# Patient Record
Sex: Female | Born: 1955 | Race: White | Hispanic: No | Marital: Married | Smoking: Never smoker
Health system: Southern US, Community
[De-identification: ages and names within clinical notes are randomized; demographics above are authoritative.]

## PROBLEM LIST (undated history)

## (undated) DIAGNOSIS — L299 Pruritus, unspecified: Secondary | ICD-10-CM

## (undated) DIAGNOSIS — I10 Essential (primary) hypertension: Secondary | ICD-10-CM

## (undated) DIAGNOSIS — F329 Major depressive disorder, single episode, unspecified: Secondary | ICD-10-CM

## (undated) DIAGNOSIS — F32A Depression, unspecified: Secondary | ICD-10-CM

## (undated) DIAGNOSIS — R51 Headache: Secondary | ICD-10-CM

## (undated) DIAGNOSIS — M1990 Unspecified osteoarthritis, unspecified site: Secondary | ICD-10-CM

## (undated) DIAGNOSIS — R519 Headache, unspecified: Secondary | ICD-10-CM

## (undated) HISTORY — PX: EYE SURGERY: SHX253

## (undated) HISTORY — PX: TONSILLECTOMY: SUR1361

## (undated) HISTORY — PX: REDUCTION MAMMAPLASTY: SUR839

---

## 1982-05-14 HISTORY — PX: REDUCTION MAMMAPLASTY: SUR839

## 1982-05-14 HISTORY — PX: BREAST REDUCTION SURGERY: SHX8

## 1999-04-13 ENCOUNTER — Encounter (INDEPENDENT_AMBULATORY_CARE_PROVIDER_SITE_OTHER): Payer: Self-pay

## 1999-04-13 ENCOUNTER — Other Ambulatory Visit: Admission: RE | Admit: 1999-04-13 | Discharge: 1999-04-13 | Payer: Self-pay | Admitting: Obstetrics and Gynecology

## 1999-08-18 ENCOUNTER — Other Ambulatory Visit: Admission: RE | Admit: 1999-08-18 | Discharge: 1999-08-18 | Payer: Self-pay | Admitting: Obstetrics and Gynecology

## 2000-12-19 ENCOUNTER — Other Ambulatory Visit: Admission: RE | Admit: 2000-12-19 | Discharge: 2000-12-19 | Payer: Self-pay | Admitting: Obstetrics and Gynecology

## 2001-01-06 ENCOUNTER — Encounter: Payer: Self-pay | Admitting: Obstetrics and Gynecology

## 2001-01-06 ENCOUNTER — Encounter: Admission: RE | Admit: 2001-01-06 | Discharge: 2001-01-06 | Payer: Self-pay | Admitting: Obstetrics and Gynecology

## 2001-01-08 ENCOUNTER — Encounter: Payer: Self-pay | Admitting: Obstetrics and Gynecology

## 2001-01-08 ENCOUNTER — Encounter: Admission: RE | Admit: 2001-01-08 | Discharge: 2001-01-08 | Payer: Self-pay | Admitting: Obstetrics and Gynecology

## 2001-12-19 ENCOUNTER — Other Ambulatory Visit: Admission: RE | Admit: 2001-12-19 | Discharge: 2001-12-19 | Payer: Self-pay | Admitting: Obstetrics and Gynecology

## 2002-08-17 ENCOUNTER — Ambulatory Visit (HOSPITAL_COMMUNITY): Admission: RE | Admit: 2002-08-17 | Discharge: 2002-08-17 | Payer: Self-pay | Admitting: *Deleted

## 2002-09-04 ENCOUNTER — Encounter: Admission: RE | Admit: 2002-09-04 | Discharge: 2002-09-04 | Payer: Self-pay | Admitting: Obstetrics and Gynecology

## 2002-09-04 ENCOUNTER — Encounter: Payer: Self-pay | Admitting: Obstetrics and Gynecology

## 2003-05-15 HISTORY — PX: GASTRIC BYPASS: SHX52

## 2003-08-17 ENCOUNTER — Encounter: Admission: RE | Admit: 2003-08-17 | Discharge: 2003-08-17 | Payer: Self-pay | Admitting: Obstetrics and Gynecology

## 2003-08-19 ENCOUNTER — Encounter: Admission: RE | Admit: 2003-08-19 | Discharge: 2003-08-19 | Payer: Self-pay | Admitting: Surgery

## 2003-08-24 ENCOUNTER — Encounter: Admission: RE | Admit: 2003-08-24 | Discharge: 2003-11-22 | Payer: Self-pay | Admitting: Surgery

## 2003-08-27 ENCOUNTER — Ambulatory Visit (HOSPITAL_COMMUNITY): Admission: RE | Admit: 2003-08-27 | Discharge: 2003-08-27 | Payer: Self-pay | Admitting: Surgery

## 2003-12-16 ENCOUNTER — Encounter: Admission: RE | Admit: 2003-12-16 | Discharge: 2004-01-10 | Payer: Self-pay | Admitting: Surgery

## 2004-01-04 ENCOUNTER — Inpatient Hospital Stay (HOSPITAL_COMMUNITY): Admission: RE | Admit: 2004-01-04 | Discharge: 2004-01-06 | Payer: Self-pay | Admitting: Surgery

## 2004-02-14 ENCOUNTER — Encounter: Admission: RE | Admit: 2004-02-14 | Discharge: 2004-05-14 | Payer: Self-pay | Admitting: Surgery

## 2004-02-17 ENCOUNTER — Other Ambulatory Visit: Admission: RE | Admit: 2004-02-17 | Discharge: 2004-02-17 | Payer: Self-pay | Admitting: Obstetrics and Gynecology

## 2004-05-14 HISTORY — PX: CHOLECYSTECTOMY: SHX55

## 2004-06-15 ENCOUNTER — Ambulatory Visit (HOSPITAL_COMMUNITY): Admission: RE | Admit: 2004-06-15 | Discharge: 2004-06-15 | Payer: Self-pay | Admitting: Gastroenterology

## 2004-07-14 ENCOUNTER — Encounter: Admission: RE | Admit: 2004-07-14 | Discharge: 2004-10-12 | Payer: Self-pay | Admitting: Surgery

## 2004-10-27 ENCOUNTER — Encounter: Admission: RE | Admit: 2004-10-27 | Discharge: 2004-10-27 | Payer: Self-pay | Admitting: Obstetrics and Gynecology

## 2004-10-31 ENCOUNTER — Encounter (INDEPENDENT_AMBULATORY_CARE_PROVIDER_SITE_OTHER): Payer: Self-pay | Admitting: *Deleted

## 2004-10-31 ENCOUNTER — Observation Stay (HOSPITAL_COMMUNITY): Admission: RE | Admit: 2004-10-31 | Discharge: 2004-10-31 | Payer: Self-pay | Admitting: Surgery

## 2005-02-13 ENCOUNTER — Ambulatory Visit (HOSPITAL_COMMUNITY): Admission: RE | Admit: 2005-02-13 | Discharge: 2005-02-13 | Payer: Self-pay | Admitting: Surgery

## 2005-03-01 ENCOUNTER — Other Ambulatory Visit: Admission: RE | Admit: 2005-03-01 | Discharge: 2005-03-01 | Payer: Self-pay | Admitting: Obstetrics and Gynecology

## 2005-03-20 ENCOUNTER — Encounter: Admission: RE | Admit: 2005-03-20 | Discharge: 2005-03-20 | Payer: Self-pay | Admitting: Obstetrics and Gynecology

## 2005-04-14 IMAGING — US US ABDOMEN COMPLETE
2 series · 13 of 25 positions shown · non-contrast
Comparison: none

CLINICAL DATA: Pre-operative respiratory examination.  Gastric bypass. 
 PA AND LATERAL CHEST:   
 The heart size and mediastinal contours are unremarkable.  The lungs are clear.  The visualized skeleton is unremarkable.

[Series 1: unknown · 0.27mm/px · 2 of 11 slices shown (1 of 2)]
[im 1/11]
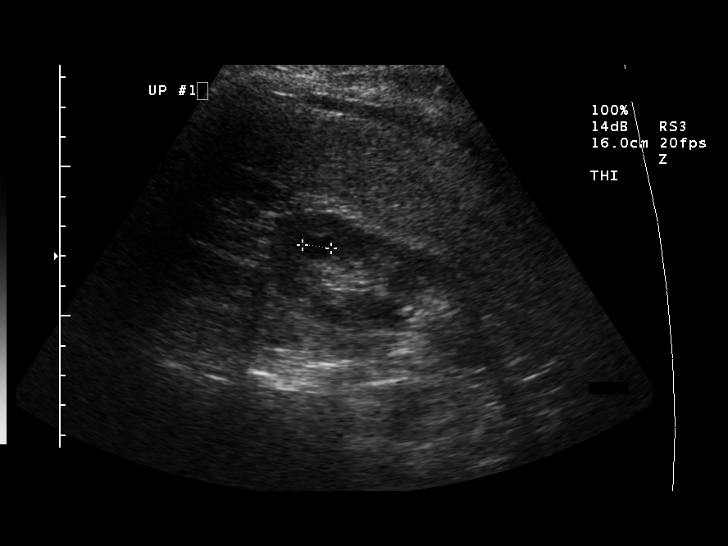
[im 11/11]
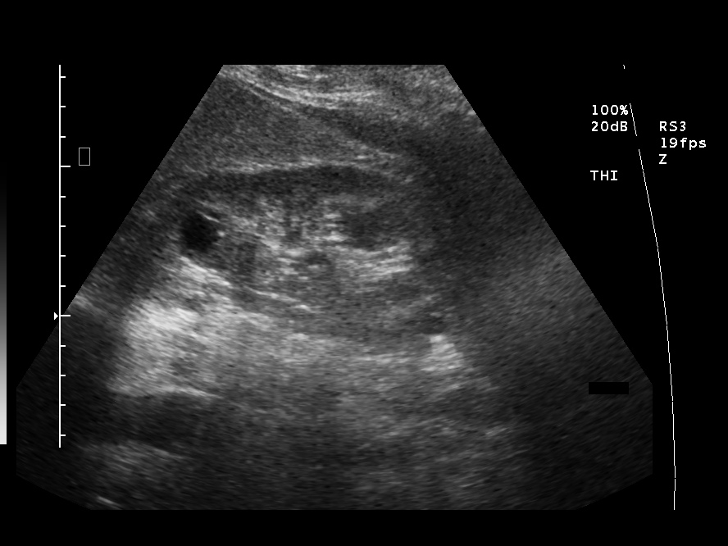

[Series 1: unknown · 0.27mm/px · 11 of 94 slices shown (2 of 2)]
[im 5/94]
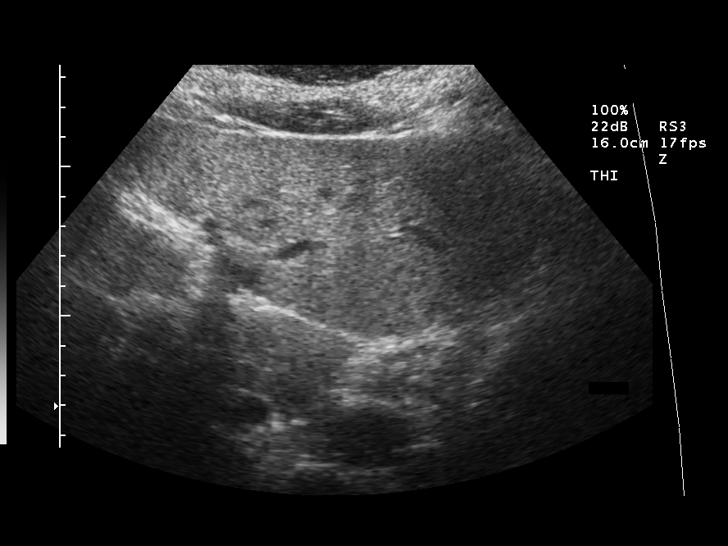
[im 14/94]
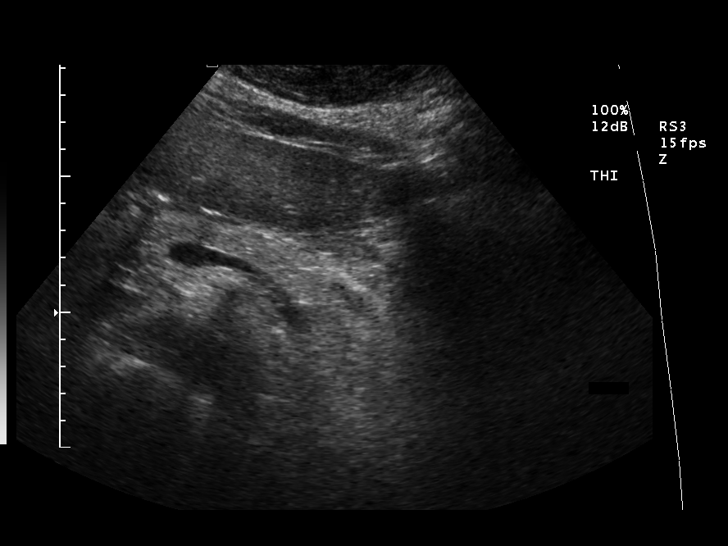
[im 23/94]
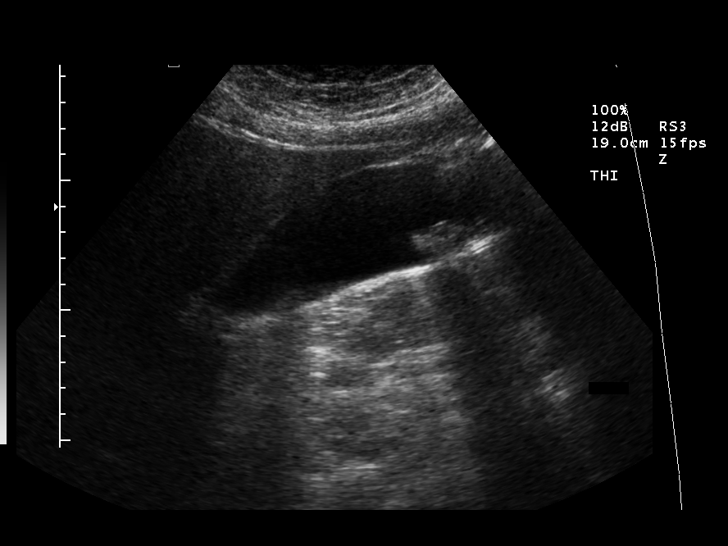
[im 32/94]
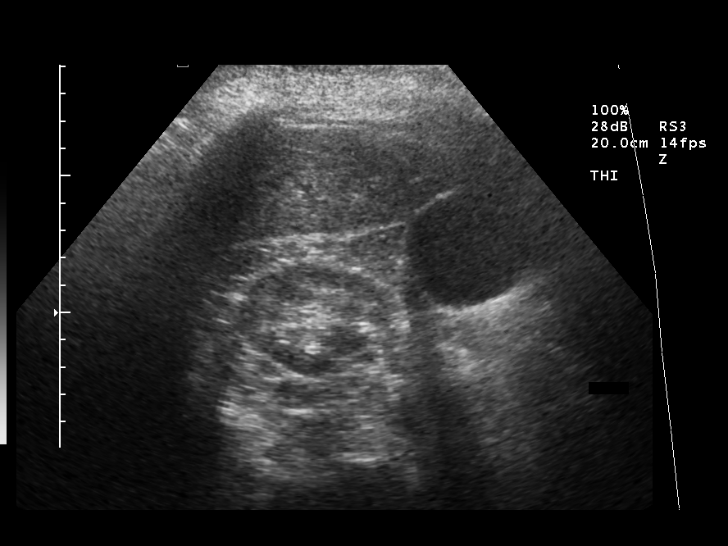
[im 40/94]
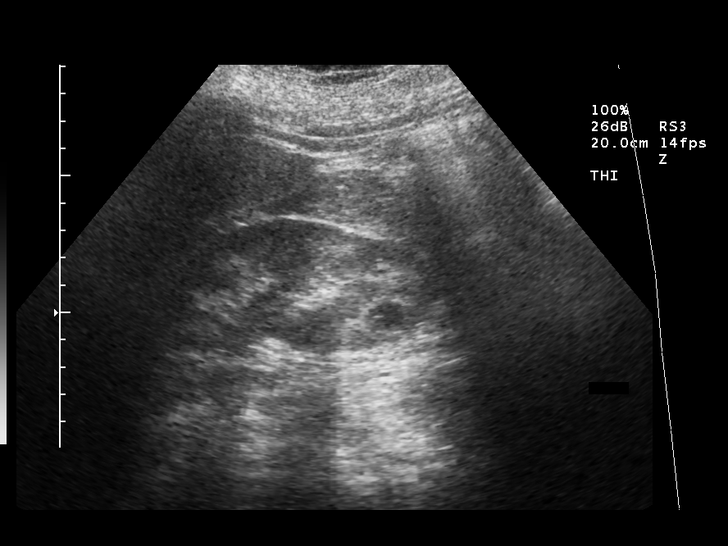
[im 49/94]
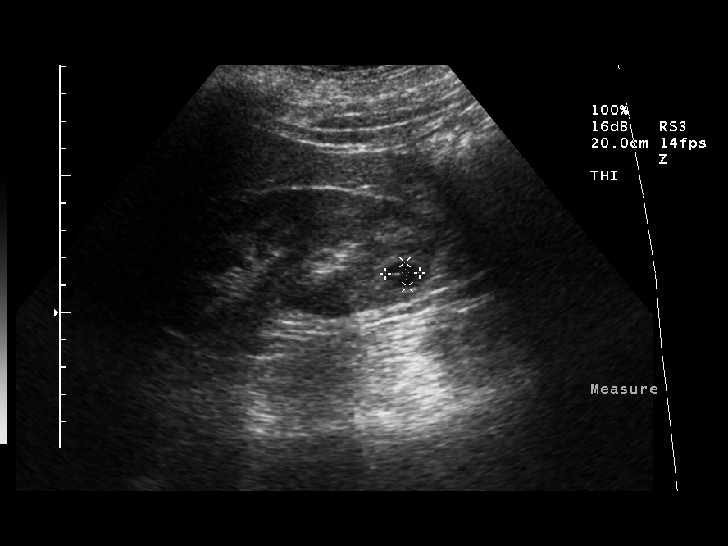
[im 58/94]
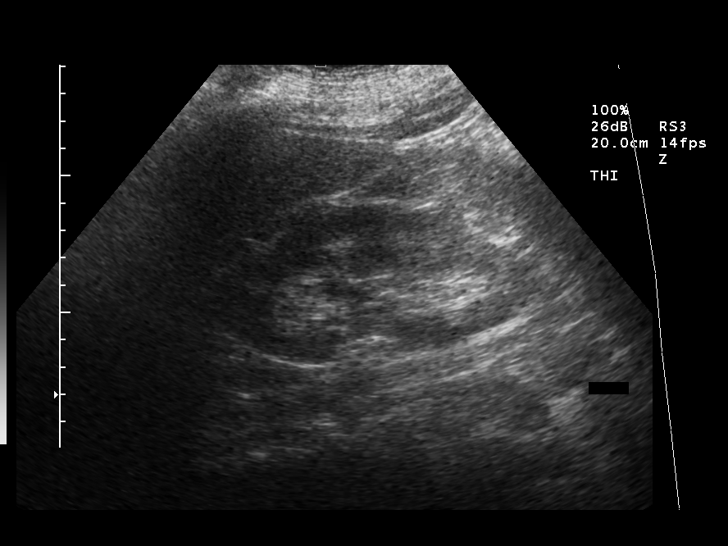
[im 67/94]
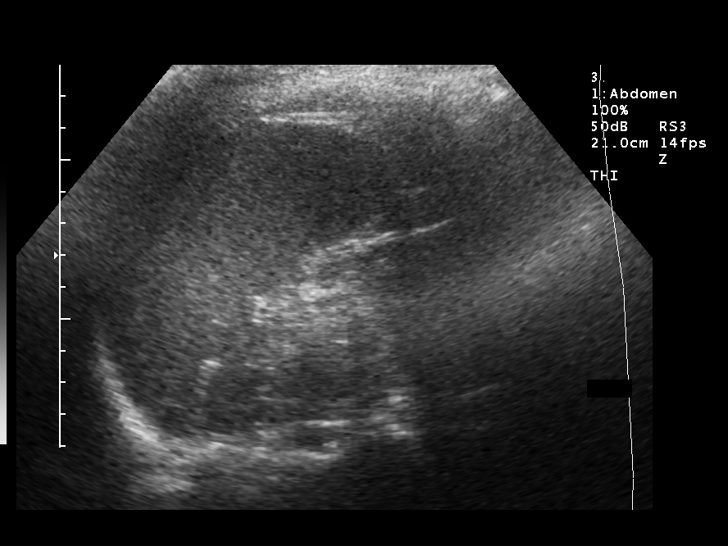
[im 76/94]
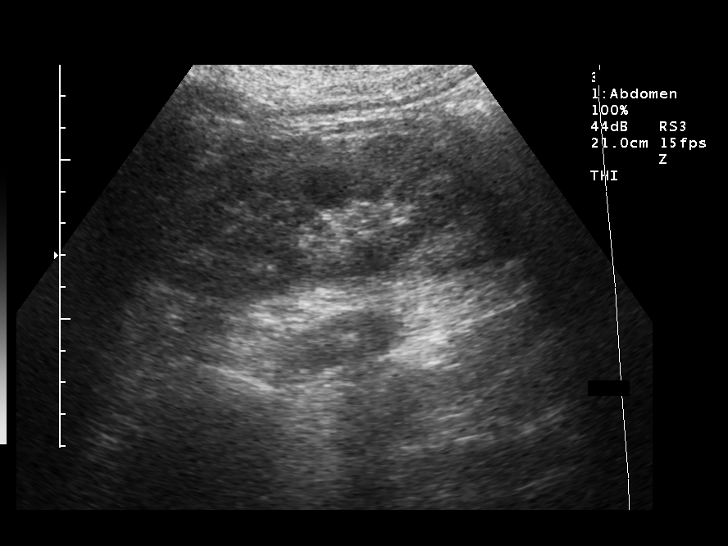
[im 85/94]
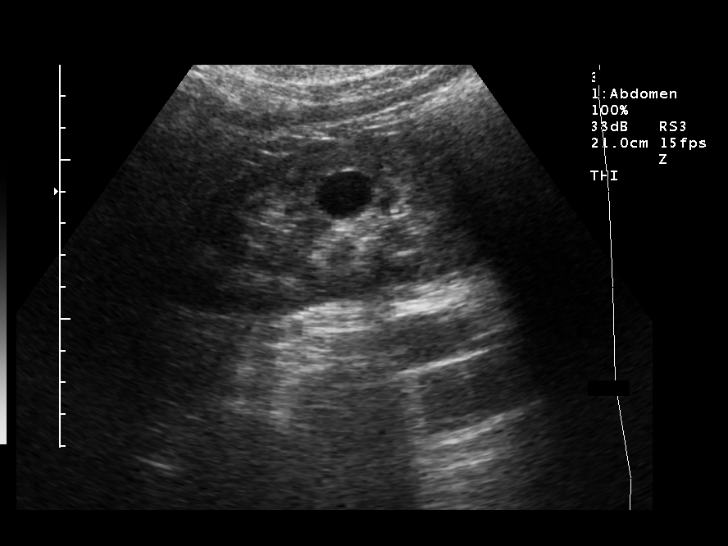
[im 94/94]
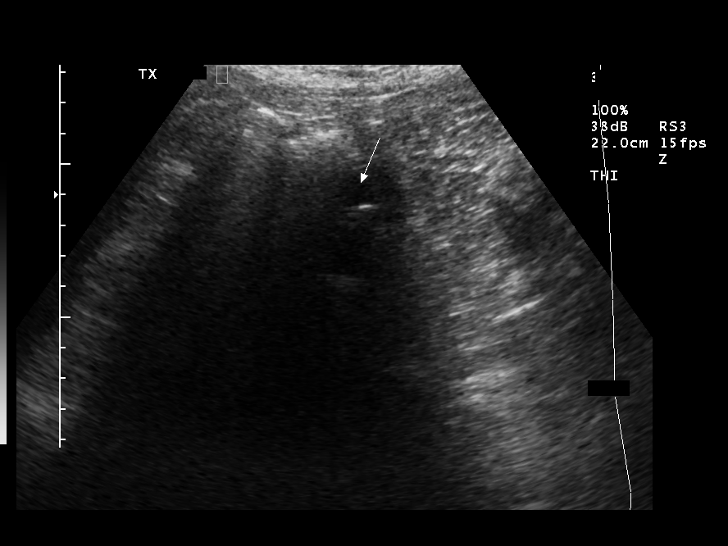

[13 of 25 positions shown; findings below may reference images not displayed]

IMPRESSION: No active disease. 
 COMPLETE ABDOMINAL ULTRASOUND: 
 Patient has multiple gallstones.  There are also some echogenic foci in the gallbladder wall consistent with adenomyomatosis.  Gallbladder wall is not thickened.  Common bile duct is normal at 4mm in diameter.  There is slight increased echogenicity of the liver parenchyma suggestive of fatty infiltration.  The pancreas, inferior vena cava, and spleen appear normal.  There is a 17mm cyst in the mid portion of the left kidney.  Left kidney measures 12.8cm overall length and otherwise appears normal.  There are three cysts in the right kidney, the largest being 1.6cm in diameter.  Tiny echogenic focus in one of the cysts which is not thought to be significant.
IMPRESSION: Multiple gallstones.  Benign appearing cysts in both kidneys.  Slight fatty liver.

## 2005-06-08 ENCOUNTER — Encounter: Admission: RE | Admit: 2005-06-08 | Discharge: 2005-06-08 | Payer: Self-pay | Admitting: Surgery

## 2005-12-06 ENCOUNTER — Encounter: Admission: RE | Admit: 2005-12-06 | Discharge: 2005-12-06 | Payer: Self-pay | Admitting: Obstetrics and Gynecology

## 2005-12-26 ENCOUNTER — Encounter: Admission: RE | Admit: 2005-12-26 | Discharge: 2005-12-26 | Payer: Self-pay | Admitting: Obstetrics and Gynecology

## 2006-05-16 ENCOUNTER — Other Ambulatory Visit: Admission: RE | Admit: 2006-05-16 | Discharge: 2006-05-16 | Payer: Self-pay | Admitting: Obstetrics and Gynecology

## 2006-12-12 ENCOUNTER — Encounter: Admission: RE | Admit: 2006-12-12 | Discharge: 2006-12-12 | Payer: Self-pay | Admitting: Obstetrics and Gynecology

## 2007-07-01 ENCOUNTER — Other Ambulatory Visit: Admission: RE | Admit: 2007-07-01 | Discharge: 2007-07-01 | Payer: Self-pay | Admitting: Obstetrics and Gynecology

## 2007-12-18 ENCOUNTER — Encounter: Admission: RE | Admit: 2007-12-18 | Discharge: 2007-12-18 | Payer: Self-pay | Admitting: Surgery

## 2008-09-09 ENCOUNTER — Other Ambulatory Visit: Admission: RE | Admit: 2008-09-09 | Discharge: 2008-09-09 | Payer: Self-pay | Admitting: Obstetrics and Gynecology

## 2008-09-23 ENCOUNTER — Encounter: Admission: RE | Admit: 2008-09-23 | Discharge: 2008-09-23 | Payer: Self-pay | Admitting: Obstetrics and Gynecology

## 2008-12-09 ENCOUNTER — Encounter: Admission: RE | Admit: 2008-12-09 | Discharge: 2008-12-09 | Payer: Self-pay | Admitting: Surgery

## 2010-01-06 ENCOUNTER — Encounter: Admission: RE | Admit: 2010-01-06 | Discharge: 2010-01-06 | Payer: Self-pay | Admitting: Obstetrics and Gynecology

## 2010-06-04 ENCOUNTER — Encounter: Payer: Self-pay | Admitting: Obstetrics and Gynecology

## 2010-09-29 NOTE — Op Note (Signed)
NAME:  Doris Anderson, Doris Anderson NO.:  [id number]   MEDICAL RECORD NO.:  [id number]                   PATIENT TYPE:  OIB   LOCATION:  2899                                 FACILITY:  [facility]   PHYSICIAN:  Corbin Ade, M.D.                 DATE OF BIRTH:  May 18, 1955   DATE OF PROCEDURE:  DATE OF DISCHARGE:  08/17/2002                                 OPERATIVE REPORT   PREOPERATIVE DIAGNOSIS:  Cataract of the left eye.   POSTOPERATIVE DIAGNOSIS:  Cataract of the left eye.   OPERATION PERFORMED:  Extracapsular cataract extraction by  phacoemulsification with placement of posterior chamber intraocular lens  implant of the left eye.   SURGEON:  Corbin Ade, M.D.   ANESTHESIA:  Topical.   DESCRIPTION OF PROCEDURE:  The patient was brought to the operating room and  placed in the supine position.  Attention was given to the left eye only.  The patient was prepped and draped in the usual sterile ophthalmic fashion.  A operating microscope was used and a lid speculum was placed on the  appropriate eye.  A 2.75 mm keratome was used to make a clear corneal  incision at the limbus at approximately the 11 o'clock position.  A clear  corneal incision was made at the limbus at approximately the 11 o'clock  position using a 2.75 mm keratome.  Through this clear corneal incision, the  anterior chamber was reinflated using Viscoat.  A 15 degree blade was then  used to make a paracentesis at the limbus at approximately the 2 o'clock  position.  A prebent cystotome was then used to initiate a capsulorrhexis  which was completed with Utrata's forceps.  Hydrodissection delineation was  done with balanced salt solution on a Steri-Tip cannula.  Phacoemulsification was then done in a divide and conquer manner without any  complications.  Residual cortexes were removed from the capsular bag using  the automated irrigation aspiration silicone handpiece.  The anterior  chamber and  the posterior capsular bag were reinflated using Provisc through  the clear corneal wound.  An SN60OT 13.5 diopter, serial number [id number]  intraocular lens was implanted directly into the capsular bag.  The silicone  tipped irrigation aspiration handpiece was then used to remove residual  Provisc from the interior of the eye through the clear corneal wound.  The  clear corneal wound was hydrated and checked for leaks.  None were found.  Pressure in the eye at the end of the case was less than 20 by  tactile sensation.  The patient had two drops of Ocuflox placed topically on  the eye at the end of the case.  The patient had a hard shield placed over  the eye at the end of the case.  The patient left the operating room in good  and stable condition.  There were no complications.  Corbin Ade, M.D.    RC/MEDQ  D:  08/17/2002  T:  08/17/2002  Job:  [id number]

## 2010-09-29 NOTE — Op Note (Signed)
NAME:  Doris Anderson, POSAS NO.:  [id number]   MEDICAL RECORD NO.:  [id number]          PATIENT TYPE:  AMB   LOCATION:  ENDO                         FACILITY:  [facility]   PHYSICIAN:  Sandria Bales. Ezzard Standing, M.D.  DATE OF BIRTH:  07-05-1955   DATE OF PROCEDURE:  02/13/2005  DATE OF DISCHARGE:                                 OPERATIVE REPORT   PREOPERATIVE DIAGNOSIS:  Epigastric abdominal pain, status post laparoscopic  Roux-en-Y gastric bypass.   POSTOPERATIVE DIAGNOSIS:  Normal Roux-en-Y gastrojejunal bypass with no  evidence of ulcer disease.   PROCEDURE:  Esophagogastroduodenoscopy.   SURGEON:  Sandria Bales. Ezzard Standing, M.D.   FIRST ASSISTANT:  None.   ANESTHESIA:  Demerol 50 mg, Versed 3 mg.   COMPLICATIONS:  None.   INDICATIONS FOR PROCEDURE:  [name] is a 55 year old white female who  underwent a laparoscopic Roux-en-Y gastric bypass in August, 2005.  She  successfully lost over 100 pounds of weight.  Then in June, 2006, she  underwent a cholecystectomy and closure of ventral hernias.  She has  developed epigastric abdominal pain of unclear etiology.  She has been  placed on Nexium for about 2-3 weeks and has noted significant improvement  in her symptoms.  She now comes for upper endoscopy to evaluate her pouch  and possibly her ulcer disease.  Indications and potential complications were explained to the patient.   OPERATIVE NOTE:  [name] is monitoered with a blood pressure cuff,  Pulse oximetry, EKG, and had 2 liters of nasal O2 flowing during the  procedure.  The back of her throat was first anesthetized with Cetacaine.  She was then rolled to her left lateral decubitus position and given 50 mg  of Demerol and 3 mg of Versed IV.   A flexible Olympus endoscope was passed without difficulty down the throat  into her stomach pouch.  I identified both limbs of her jejunostomy.  I was  able to go some 25-30 cm down her functioning jejunal limb.  Her  gastrojejunal anastomosis was noted at 44 cm from her incisors.  Her  gastroesophageal junction was about 39 cm from her incisors for about a 5 cm  pouch.  She may have a minimal or modest hiatal hernia of 1-2 cm, but she  had no evidence of ulcer disease.  The mucosa of both her jejunum and her  stomach were unremarkable.  Her esophagus was unremarkable.  We found no  source or cause for her pain.   The plan is for her to continue Nexium for four more weeks.  That will be a  total of about 6-8 weeks of Nexium, and she can stop it.  She will be in  touch with our office if she has any further symptoms.  Her husband was at  her bedside when I discussed this with her.      Sandria Bales. Ezzard Standing, M.D.  Electronically Signed     DHN/MEDQ  D:  02/13/2005  T:  02/13/2005  Job:  [id number]   cc:   Artist Pais, M.D.  Fax: [phone]   Duncan Dull,  M.D.  Fax: [phone]

## 2010-09-29 NOTE — Op Note (Signed)
NAME:  RHIANON, ZABAWA NO.:  [id number]   MEDICAL RECORD NO.:  [id number]          PATIENT TYPE:  AMB   LOCATION:  ENDO                         FACILITY:  [facility]   PHYSICIAN:  Danise Edge, M.D.   DATE OF BIRTH:  November 15, 1955   DATE OF PROCEDURE:  06/15/2004  DATE OF DISCHARGE:                                 OPERATIVE REPORT   PROCEDURE:  Screening colonoscopy.   INDICATIONS:  Ms. Shasta Chinn is a 55 year old female born 2055/11/28.  Ms.  Gage is scheduled to undergo a peripheral screening colonoscopy with  polypectomy to prevent colon cancer.   ENDOSCOPIST:  Janeece Fitting, M.D.   PREMEDICATIONS:  Versed 6 mg, Demerol 50 mg.   PROCEDURE:  After obtaining informed consent, Ms. Gillyard was placed in the  left lateral decubitus position.  I administered intravenous Demerol and  intravenous Versed to achieve conscious sedation.   The patient's blood pressure, oxygen saturation and cardiac rhythm were  monitored throughout the procedure and admitted in the medical record.  Anal  inspection and digital rectal examination was normal.  The Olympus  adjustable pediatric colonoscope was introduced into the rectum and advanced  to the cecum.  An normal appearing ileocecal valve was intubated and the  distal ileum was inspected.  Colonic preparation to the examination today  was excellent.  The rectum was normal.   Sigmoid colon and descending colon were normal.  Splenic flexure was normal.  Transverse colon normal.  Hepatic flexure normal.  Ascending colon was  normal.  Cecum and ileocecal valve was normal.  Distal ileum was normal.   ASSESSMENT:  A normal proctocolonoscopy to the cecum.      MJ/MEDQ  D:  06/15/2004  T:  06/15/2004  Job:  [id number]   cc:   Duncan Dull, M.D.  [address]  [place]  [place] [address]  Fax: [phone]   Artist Pais, M.D.  [address], [address]  [place]  [place] [address]  Fax: [phone]   Sandria Bales. Ezzard Standing, M.D.  [address]  [place]  [place] [address]

## 2010-09-29 NOTE — Op Note (Signed)
NAME:  Doris Anderson, Doris Anderson NO.:  [id number]   MEDICAL RECORD NO.:  [id number]          PATIENT TYPE:  OBV   LOCATION:  1420                         FACILITY:  [facility]   PHYSICIAN:  Sandria Bales. Ezzard Standing, M.D.  DATE OF BIRTH:  05-Dec-1955   DATE OF PROCEDURE:  10/31/2004  DATE OF DISCHARGE:                                 OPERATIVE REPORT   PREOPERATIVE DIAGNOSES:  Chronic cholecystitis, cholelithiasis.   POSTOPERATIVE DIAGNOSES:  Chronic cholecystitis, cholelithiasis and two  internal hernias (hernia at jejunojejunostomy, and hernia at Peterson's  defect).   PROCEDURE:  Laparoscopic cholecystectomy with intraoperative cholangiogram  and closure of internal hernias (approximately 1 hour time spent closing  internal hernias).   SURGEON:  Sandria Bales. Ezzard Standing, M.D.   FIRST ASSISTANT:  Thornton Park. Daphine Deutscher, MD.   ANESTHESIA:  General endotracheal.   ESTIMATED BLOOD LOSS:  Minimal.   INDICATIONS FOR PROCEDURE:  Ms. Ogden is a 55 year old white female who  has had a laparoscopic roux-en-Y gastric bypass in August 2005 and has  successfully lost about 90 pounds. She has developed recurring abdominal  pain and had an ultrasound revealing gallstones and felt to have probable  symptomatic cholelithiasis.   I discussed with her the indications and potential complications of surgery.  The potential complications include but not limited to bleeding, infection,  bowel injury, bile duct injury and the possibility of open surgery. Also I  told her that we would look for any problems with her roux-en-Y bypass at  the same time we did her surgery.   DESCRIPTION OF PROCEDURE:  The patient was placed in the supine position,  given a general endotracheal anesthesia. Her abdomen was prepped with  Betadine solution and sterilely draped. She was given 1 g of Ancef at the  initiation of the procedure. I placed a Hasson trocar at the umbilical port  and secured this with a #0 Vicryl suture.   I placed three additional trocars, a 10 mm subxiphoid trocar, a 5 mm right  mid subcostal trocar and a 5 mm lateral subcostal trocar. The gallbladder  was identified, it was distended. We actually decompressed the gallbladder  and she had a hydrops of her gallbladder.   There was clear or white bile. Dissection was carried out at the gallbladder  cystic duct junction. I developed the triangle of Calot, identified the  cystic duct. I placed a clip on the gallbladder side of the cystic duct and  shot an intraoperative cholangiogram.   The intraoperative cholangiogram was shot using a cut off taut catheter  inserted through a 14 gauge Jelco catheter into the side of the cystic duct  and secured with an endoclip. I used about 6 mL of 1/2 strength Hypaque  solution and this showed free flow of contrast down the cystic duct into the  common bile duct and down into the duodenum. There is no filling defect, no  mass and contrast reflects up into the hepatic radicles.   This was a normal intraoperative cholangiogram. The taut catheter was then  removed and the cystic duct triply endoclipped and divided.   I identified  at least two branches of the cystic artery, I clipped these  doubly both times.   The gallbladder was then sharply and bluntly dissected from the gallbladder  bed. Prior to complete division of the gallbladder from the gallbladder bed,  I reinspected the triangle of Calot, reinspected the gallbladder bed and  there was no bleeding and no bile leak. The gallbladder was then placed into  an EndoCatch bag, delivered through the umbilicus, sent to pathology.   I then did a gentle exploration with the laparoscope and I looked back at  her roux-en-Y bypass. Unfortunately she had two internal hernias which were  obvious at examination. One was at the jejunojejunostomy and the other was  at the Peterson's defect.   I  closed both these defects laparoscopically sewing these defects  closed.  Using 2-0 silk suture, I used two sutures for the Peterson's defect, I used  a single suture for the jejunojejunal defect.   I spent approximately one hour both identifying this hernia defect and  repairing them.   She tolerated this procedure well but because of the identification of  internal hernias, rather than going home today, I will plan to keep her  overnight unless she does real well.   I removed the trocars under direct visualization and there was no bleeding  at the trocar site. The umbilical port was closed with a #0 Vicryl suture.  The skin at each site was closed with a 5-0 Vicryl suture, painted with  tincture of Benzoin and Steri-Strips.   She tolerated the procedure well and was transported to the recovery room in  good condition. Sponge and needle counts were correct at the end of the  case.       DHN/MEDQ  D:  10/31/2004  T:  10/31/2004  Job:  [id number]   cc:   Duncan Dull, M.D.  [address]  [address]  [place]  [place] [address]  Fax: [phone]   Artist Pais, M.D.  [address], [address]  [place]  [place] [address]  Fax: [phone]

## 2010-09-29 NOTE — Op Note (Signed)
NAME:  Doris Anderson, Doris Anderson                       ACCOUNT NO.:  [id number]   MEDICAL RECORD NO.:  [id number]                   PATIENT TYPE:  INP   LOCATION:  0462                                 FACILITY:  [facility]   PHYSICIAN:  Sandria Bales. Ezzard Standing, M.D.               DATE OF BIRTH:  November 03, 1955   DATE OF PROCEDURE:  01/04/2004  DATE OF DISCHARGE:                                 OPERATIVE REPORT   PREOPERATIVE DIAGNOSIS:  Morbid obesity with a body mass index of 45.7.   POSTOPERATIVE DIAGNOSIS:  Morbid obesity with a body mass index of 45.7.   PROCEDURE:  Laparoscopic Roux-en-Y (endocolic/endogastric).   SURGEON:  Sandria Bales. Ezzard Standing, M.D.   FIRST ASSISTANT:  Thornton Park. Daphine Deutscher, M.D.   ANESTHESIA:  General endotracheal.   ESTIMATED BLOOD LOSS:  Minimal.   INDICATIONS FOR PROCEDURE:  Ms. Zeimet is a 55 year old white female who  has been morbidly obese much of her adult life.  She has failed multiple  diets, has been through our preoperative bariatric surgery program, and now  comes for attempted laparoscopic Roux-en-Y gastric bypass.   The patient's preoperative BMI is 45.7 with a weight of 248 pounds.  She  understands the indications and potential risks of the procedure.  The  potential risks include but are not limited to bleeding, infection, bowel  leak, deep venous thrombosis, and permanent change in dieting, long term.   Patient presented to the operating room, where she was given Lovenox  preoperatively and Cefotetan preoperatively.  She had PAS stockings in  place.  Her abdomen was prepped with Betadine solution and sterilely draped  after general anesthesia, supervised by Dr. Frutoso Chase.  The abdomen was  prepped with Betadine solution and sterilely draped.  I entered the abdomen,  using a 12 mm trocar as a puncture technique into the abdominal cavity.  I  insufflated the abdomen.  I then placed a trocar opposite the umbilicus, a  10/11 mm trocar.  I placed 12 mm trocars,  right subcostal, right paramedian,  left paramedian, and then a 5 mm left lateral subcostal.   Abdominal exploration carried out revealed the right and left lobes of the  liver was unremarkable.  The gallbladder had known gallstones, and we  discussed this with her.  It really did not look grossly normal.  Her  stomach was mildly distended but otherwise normal.  Her spleen was  unremarkable.  The bowel, which we could see, was unremarkable, and down  towards her pelvis was unremarkable.   My first thing was to identify the ligament of Treitz, which I did.  I  counted back approximately 40 cm and identified the small bowel.  It had  adequate mesentery to reach up to the stomach.  I then divided the small  bowel with a single firing of the Endo GIA-45 stapler.   I took down the mesentery using a harmonic scalpel.  I marked  the future  gastric limb with a Penrose drain and a pseudosilk suture and counted 100  cm, actually about 105 cm, for the gastric limb.  I then did a side-to-side  stapled anastomosis using an Endo GIA-45 stapler.  I then closed the  enterotomy with two running 2-0 Vicryl sutures.  I then closed the mesentery  with a running 2-0 silk suture.  The mesentery was closed.  The  jejunojejunostomy looked good and viable.  I had to put an extra stitch in  the anterior surface.  I then covered this with about 1.5 cc of Tisseel.  I  then turned my attention to the upper abdomen.  I placed a liver retractor  using a Nathanson retractor and placed this under the left lobe of the  liver.  I then first cleared off the angle of His off the greater curvature  of the stomach between the greater curvature of the stomach and the  diaphragm.  I then went along the lesser curvature, approximately 4-5 cm,  and got into the lesser sac and did four firings across the stomach, one  transversely and then freed up the wall of the stomach, to create a pouch,  which was about 3 cm in diameter,  about 4-5 cm in length.  I checked for  hemostasis.  There was no bleeding.  I then placed a seal at the GE junction  and along the distal stomach pouch.   I then brought my small bowel up and did a side-to-side anastomosis.  I did  a posterior layer with a 2-0 Vicryl suture.  Then using a Ewald tube, I  directed into the stomach and did a firing, making about a 2 to 2.5 cm  opening using an Endo GIA stapler.  I closed the enterotomy with two running  2-0 Vicryl sutures.  I then did an anterior 2-0 Vicryl suture over the  staple line.  This is all done antecolic and antegastric.   At this point, Dr. Daphine Deutscher __________ up and did an  esophagogastroduodenoscopy.  He will dictate this portion of the operation.  Highlights include that I clamped off the bowel.  He was able to get the  scope into the stomach without difficulty.  The gastrojejunal junction was  right at 44-45 cm.  The GE junction was right at 39 cm for about a 5 cm  pouch.  There was no bleeding.  There was no evidence of air leak or leak  from the anastomosis.   I then irrigated out the abdomen.  I brought a 10 mm Blake drain through the  5 mm port in the left subcostal area.  I then reinspected both the  gastrojejunal anastomosis, which I covered with Tisseel, and the  jejunojejunal anastomosis, and both looked patent with no evidence of any  ischemic bowel or compromised bowel.  I then removed the trocars in turn,  showing no bleeding in any trocar site.  I closed each skin site with 5-0  Vicryl suture, painted each one with tincture of Benzoin and Steri-Strips.  The Blake drain was sewn in place with a 2-0 nylon suture.   The patient tolerated the procedure well and was transported to the recovery  room in good condition.  Sponge and needle counts were correct at the end of  the case.  Sandria Bales. Ezzard Standing, M.D.    DHN/MEDQ  D:  01/04/2004  T:  01/04/2004  Job:  [id number]   cc:    Duncan Dull, M.D.  [address]  [place]  [place] [address]  Fax: [phone]   Artist Pais, M.D.  [address], [address]  [place]  [place] [address]  Fax: [phone]

## 2010-09-29 NOTE — Op Note (Signed)
NAME:  [name], [name]                       ACCOUNT NO.:  [id number]   MEDICAL RECORD NO.:  [id number]                   PATIENT TYPE:  INP   LOCATION:  0462                                 FACILITY:  [facility]   PHYSICIAN:  Thornton Park. Daphine Deutscher, M.D.             DATE OF BIRTH:  [birth year]   DATE OF PROCEDURE:  01/04/2004  DATE OF DISCHARGE:                                 OPERATIVE REPORT   PROCEDURE:  Upper endoscopy, status post laparoscopic Roux-en-Y gastric  bypass.   OPERATOR:  Thornton Park. Daphine Deutscher, M.D.   DESCRIPTION OF PROCEDURE:  Doris Anderson is a 55 year old lady  undergoing laparoscopic Roux Y gastric bypass.  When the proximal  anastomosis had been created, I broke scrub and went to the head of the  table where I passed the flexible endoscope.  This was introduced into the  hypopharynx with my finger and gently passed into the proximal esophagus as  it came down under videoscopic clearance.  It passed easily down to the  esophagogastric junction which was right about 38-39 cm.  We then entered  into what appeared to be a small gastric pouch.  It did not show any  evidence of bleeding.  I insufflated with gas while Dr. Ezzard Standing clamped the  distal bowel, and we got insufflation, and he was observed for evidence of  bubbles or evidence of a leak.  None were seen.  I then passed the scope  down to the anastomosis where we could readily see both limbs of the small  intestinal anastomosis.  Suture and staple line was not bleeding and was  intact.  Pouch measured approximately 5 cm in length on the way back.  The  pouch was then decompressed, and the scope was withdrawn without difficulty.                                               Thornton Park Daphine Deutscher, M.D.    MBM/MEDQ  D:  01/04/2004  T:  01/04/2004  Job:  [id number]   cc:   Sandria Bales. Ezzard Standing, M.D.  [address]  [place]  [place] [address]  Fax: [phone]

## 2010-12-06 ENCOUNTER — Other Ambulatory Visit: Payer: Self-pay | Admitting: Family Medicine

## 2010-12-06 ENCOUNTER — Other Ambulatory Visit (HOSPITAL_COMMUNITY)
Admission: RE | Admit: 2010-12-06 | Discharge: 2010-12-06 | Disposition: A | Discharge status: Home / Self Care | Payer: Commercial Indemnity | Source: Ambulatory Visit | Attending: Family Medicine | Admitting: Family Medicine

## 2010-12-06 DIAGNOSIS — Z124 Encounter for screening for malignant neoplasm of cervix: Secondary | ICD-10-CM | POA: Insufficient documentation

## 2010-12-22 ENCOUNTER — Other Ambulatory Visit: Payer: Self-pay | Admitting: Family Medicine

## 2010-12-22 DIAGNOSIS — Z1231 Encounter for screening mammogram for malignant neoplasm of breast: Secondary | ICD-10-CM

## 2011-01-09 ENCOUNTER — Ambulatory Visit: Payer: Self-pay

## 2011-01-25 ENCOUNTER — Ambulatory Visit
Admission: RE | Admit: 2011-01-25 | Discharge: 2011-01-25 | Disposition: A | Discharge status: Home / Self Care | Payer: Commercial Indemnity | Source: Ambulatory Visit | Attending: Family Medicine | Admitting: Family Medicine

## 2011-01-25 ENCOUNTER — Other Ambulatory Visit: Payer: Self-pay | Admitting: Family Medicine

## 2011-01-25 DIAGNOSIS — Z1231 Encounter for screening mammogram for malignant neoplasm of breast: Secondary | ICD-10-CM

## 2011-01-29 ENCOUNTER — Other Ambulatory Visit: Payer: Self-pay | Admitting: Interventional Cardiology

## 2011-01-29 ENCOUNTER — Inpatient Hospital Stay: Admission: RE | Admit: 2011-01-29 | Payer: Commercial Indemnity | Source: Ambulatory Visit

## 2011-01-29 ENCOUNTER — Ambulatory Visit: Payer: Commercial Indemnity

## 2011-01-29 DIAGNOSIS — N63 Unspecified lump in unspecified breast: Secondary | ICD-10-CM

## 2011-01-30 ENCOUNTER — Other Ambulatory Visit: Payer: Self-pay | Admitting: Family Medicine

## 2011-01-30 DIAGNOSIS — R928 Other abnormal and inconclusive findings on diagnostic imaging of breast: Secondary | ICD-10-CM

## 2011-02-08 ENCOUNTER — Other Ambulatory Visit: Payer: Self-pay | Admitting: Family Medicine

## 2011-02-08 ENCOUNTER — Ambulatory Visit
Admission: RE | Admit: 2011-02-08 | Discharge: 2011-02-08 | Disposition: A | Discharge status: Home / Self Care | Payer: Commercial Indemnity | Source: Ambulatory Visit | Attending: Family Medicine | Admitting: Family Medicine

## 2011-02-08 DIAGNOSIS — N6320 Unspecified lump in the left breast, unspecified quadrant: Secondary | ICD-10-CM

## 2011-02-08 DIAGNOSIS — R928 Other abnormal and inconclusive findings on diagnostic imaging of breast: Secondary | ICD-10-CM

## 2012-01-08 ENCOUNTER — Other Ambulatory Visit: Payer: Self-pay | Admitting: Family Medicine

## 2012-01-08 DIAGNOSIS — Z1231 Encounter for screening mammogram for malignant neoplasm of breast: Secondary | ICD-10-CM

## 2012-02-18 ENCOUNTER — Ambulatory Visit
Admission: RE | Admit: 2012-02-18 | Discharge: 2012-02-18 | Disposition: A | Discharge status: Home / Self Care | Payer: Commercial Indemnity | Source: Ambulatory Visit | Attending: Family Medicine | Admitting: Family Medicine

## 2012-02-18 DIAGNOSIS — Z1231 Encounter for screening mammogram for malignant neoplasm of breast: Secondary | ICD-10-CM

## 2013-03-16 ENCOUNTER — Other Ambulatory Visit: Payer: Self-pay

## 2013-03-16 ENCOUNTER — Ambulatory Visit: Payer: Commercial Indemnity

## 2013-03-16 DIAGNOSIS — Z1231 Encounter for screening mammogram for malignant neoplasm of breast: Secondary | ICD-10-CM

## 2013-04-10 ENCOUNTER — Ambulatory Visit: Payer: Commercial Indemnity

## 2013-05-12 ENCOUNTER — Ambulatory Visit
Admission: RE | Admit: 2013-05-12 | Discharge: 2013-05-12 | Disposition: A | Discharge status: Home / Self Care | Payer: Managed Care, Other (non HMO) | Source: Ambulatory Visit

## 2013-05-12 DIAGNOSIS — Z1231 Encounter for screening mammogram for malignant neoplasm of breast: Secondary | ICD-10-CM

## 2013-12-16 ENCOUNTER — Other Ambulatory Visit: Payer: Self-pay | Admitting: Family Medicine

## 2013-12-16 ENCOUNTER — Other Ambulatory Visit (HOSPITAL_COMMUNITY)
Admission: RE | Admit: 2013-12-16 | Discharge: 2013-12-16 | Disposition: A | Discharge status: Home / Self Care | Payer: Managed Care, Other (non HMO) | Source: Ambulatory Visit | Attending: Family Medicine | Admitting: Family Medicine

## 2013-12-16 DIAGNOSIS — Z124 Encounter for screening for malignant neoplasm of cervix: Secondary | ICD-10-CM | POA: Insufficient documentation

## 2013-12-17 LAB — CYTOLOGY - PAP

## 2014-09-06 ENCOUNTER — Other Ambulatory Visit: Payer: Self-pay

## 2014-09-06 DIAGNOSIS — Z1231 Encounter for screening mammogram for malignant neoplasm of breast: Secondary | ICD-10-CM

## 2014-09-16 ENCOUNTER — Ambulatory Visit
Admission: RE | Admit: 2014-09-16 | Discharge: 2014-09-16 | Disposition: A | Discharge status: Home / Self Care | Payer: Managed Care, Other (non HMO) | Source: Ambulatory Visit

## 2014-09-16 DIAGNOSIS — Z1231 Encounter for screening mammogram for malignant neoplasm of breast: Secondary | ICD-10-CM

## 2015-06-23 ENCOUNTER — Other Ambulatory Visit: Payer: Self-pay | Admitting: Gastroenterology

## 2015-08-01 ENCOUNTER — Encounter (HOSPITAL_COMMUNITY): Payer: Self-pay | Admitting: *Deleted

## 2015-08-09 ENCOUNTER — Encounter (HOSPITAL_COMMUNITY)
Admission: RE | Disposition: A | Discharge status: Home / Self Care | Payer: Self-pay | Source: Ambulatory Visit | Attending: Gastroenterology

## 2015-08-09 ENCOUNTER — Ambulatory Visit (HOSPITAL_COMMUNITY): Payer: Managed Care, Other (non HMO) | Admitting: Certified Registered Nurse Anesthetist

## 2015-08-09 ENCOUNTER — Encounter (HOSPITAL_COMMUNITY): Payer: Self-pay

## 2015-08-09 ENCOUNTER — Ambulatory Visit (HOSPITAL_COMMUNITY)
Admission: RE | Admit: 2015-08-09 | Discharge: 2015-08-09 | Disposition: A | Discharge status: Home / Self Care | Payer: Managed Care, Other (non HMO) | Source: Ambulatory Visit | Attending: Gastroenterology | Admitting: Gastroenterology

## 2015-08-09 DIAGNOSIS — Z6833 Body mass index (BMI) 33.0-33.9, adult: Secondary | ICD-10-CM | POA: Diagnosis not present

## 2015-08-09 DIAGNOSIS — Z79899 Other long term (current) drug therapy: Secondary | ICD-10-CM | POA: Diagnosis not present

## 2015-08-09 DIAGNOSIS — Z9884 Bariatric surgery status: Secondary | ICD-10-CM | POA: Diagnosis not present

## 2015-08-09 DIAGNOSIS — E669 Obesity, unspecified: Secondary | ICD-10-CM | POA: Diagnosis not present

## 2015-08-09 DIAGNOSIS — I10 Essential (primary) hypertension: Secondary | ICD-10-CM | POA: Insufficient documentation

## 2015-08-09 DIAGNOSIS — Z1211 Encounter for screening for malignant neoplasm of colon: Secondary | ICD-10-CM | POA: Diagnosis present

## 2015-08-09 DIAGNOSIS — Z9049 Acquired absence of other specified parts of digestive tract: Secondary | ICD-10-CM | POA: Insufficient documentation

## 2015-08-09 HISTORY — DX: Major depressive disorder, single episode, unspecified: F32.9

## 2015-08-09 HISTORY — PX: COLONOSCOPY WITH PROPOFOL: SHX5780

## 2015-08-09 HISTORY — DX: Unspecified osteoarthritis, unspecified site: M19.90

## 2015-08-09 HISTORY — DX: Headache: R51

## 2015-08-09 HISTORY — DX: Headache, unspecified: R51.9

## 2015-08-09 HISTORY — DX: Essential (primary) hypertension: I10

## 2015-08-09 HISTORY — DX: Pruritus, unspecified: L29.9

## 2015-08-09 HISTORY — DX: Depression, unspecified: F32.A

## 2015-08-09 SURGERY — COLONOSCOPY WITH PROPOFOL
Anesthesia: Monitor Anesthesia Care

## 2015-08-09 MED ORDER — PROPOFOL 10 MG/ML IV BOLUS
INTRAVENOUS | Status: AC
  Filled 2015-08-09: qty 20

## 2015-08-09 MED ORDER — SODIUM CHLORIDE 0.9 % IV SOLN: INTRAVENOUS | Status: DC

## 2015-08-09 MED ORDER — PROPOFOL 10 MG/ML IV BOLUS
INTRAVENOUS | Status: DC | PRN
  Administered 2015-08-09: 100 mg via INTRAVENOUS
  Administered 2015-08-09: 40 mg via INTRAVENOUS
  Administered 2015-08-09 (×2): 30 mg via INTRAVENOUS
  Administered 2015-08-09: 50 mg via INTRAVENOUS

## 2015-08-09 MED ORDER — LACTATED RINGERS IV SOLN
INTRAVENOUS | Status: DC
  Administered 2015-08-09: 12:00:00 via INTRAVENOUS

## 2015-08-09 SURGICAL SUPPLY — 21 items

## 2015-08-09 NOTE — Op Note (Signed)
[facility] Patient Name: Doris BellowGinger Anderson Procedure Date: 08/09/2015 MRN: [id number] Attending MD: Charolett BumpersMartin K Johnson , MD Date of Birth: [birth year] CSN:  Age: [age] Admit Type: Outpatient Procedure:                Colonoscopy Indications:              Screening for colorectal malignant neoplasm. Normal                            screening colonoscopy was performed on 06/15/2004. Providers:                Charolett BumpersMartin K. Johnson, MD, Dwain SarnaPatricia Ford, RN, Darletta MollJackie                            Aiken, Technician Referring MD:              Medicines:                Propofol per Anesthesia Complications:            No immediate complications. Estimated Blood Loss:     Estimated blood loss: none. Procedure:                Pre-Anesthesia Assessment:                           - Prior to the procedure, a History and Physical                            was performed, and patient medications and                            allergies were reviewed. The patient's tolerance of                            previous anesthesia was also reviewed. The risks                            and benefits of the procedure and the sedation                            options and risks were discussed with the patient.                            All questions were answered, and informed consent                            was obtained. Prior Anticoagulants: The patient has                            taken no previous anticoagulant or antiplatelet                            agents. ASA Grade Assessment: II - A patient with  mild systemic disease. After reviewing the risks                            and benefits, the patient was deemed in                            satisfactory condition to undergo the procedure.                           After obtaining informed consent, the colonoscope                            was passed under direct vision. Throughout the   procedure, the patient's blood pressure, pulse, and                            oxygen saturations were monitored continuously. The                            EC-[id number] ([id number]) scope was introduced through                            the anus and advanced to the the cecum, identified                            by appendiceal orifice and ileocecal valve. The                            colonoscopy was performed without difficulty. The                            patient tolerated the procedure well. The quality                            of the bowel preparation was good. The terminal                            ileum, the ileocecal valve, the appendiceal orifice                            and the rectum were photographed. Scope In: 1:13:19 PM Scope Out: 1:30:32 PM Scope Withdrawal Time: 0 hours 9 minutes 10 seconds  Total Procedure Duration: 0 hours 17 minutes 13 seconds  Findings:      The perianal and digital rectal examinations were normal.      The entire examined colon appeared normal. Impression:               - The entire examined colon is normal.                           - No specimens collected. Moderate Sedation:      N/A- Per Anesthesia Care Recommendation:           - Patient has a contact number available for  emergencies. The signs and symptoms of potential                            delayed complications were discussed with the                            patient. Return to normal activities tomorrow.                            Written discharge instructions were provided to the                            patient.                           - Repeat colonoscopy in 10 years for screening                            purposes.                           - Resume previous diet.                           - Continue present medications. Procedure Code(s):        --- Professional ---                           [phone], Colonoscopy, flexible; diagnostic,  including                            collection of specimen(s) by brushing or washing,                            when performed (separate procedure) Diagnosis Code(s):        --- Professional ---                           Z12.11, Encounter for screening for malignant                            neoplasm of colon CPT copyright 2016 [employer]. All rights reserved. The codes documented in this report are preliminary and upon coder review may  be revised to meet current compliance requirements. Danise Edge, MD Charolett Bumpers, MD 08/09/2015 1:37:03 PM This report has been signed electronically. Number of Addenda: 0

## 2015-08-09 NOTE — H&P (Signed)
  Procedure: Screening colonoscopy. Normal screening colonoscopy performed on 06/15/2004  History: The patient is a 60 year old female born [birth year]. She is scheduled to undergo a repeat screening colonoscopy today.  Past medical history: Depression. Allergic rhinitis. Roux-en-Y gastric bypass surgery. Hypertension. Small fiber neuropathy. Tonsillectomy. Bilateral breast reduction surgeries. Cataract surgery. Cholecystectomy. Right rotator cuff repair. Schrager finger release on the right hand surgery.  Medication allergies: Codeine and lisinopril.  Exam: The patient is alert and lying comfortably on the endoscopy stretcher. Abdomen is soft and nontender to palpation. Lungs are clear to auscultation. Cardiac exam reveals a regular rhythm.  Plan: Proceed with screening colonoscopy

## 2015-08-09 NOTE — Transfer of Care (Signed)
Immediate Anesthesia Transfer of Care Note  Patient: Doris Anderson  Procedure(s) Performed: Procedure(s): COLONOSCOPY WITH PROPOFOL (N/A)  Patient Location: PACU  Anesthesia Type:MAC  Level of Consciousness: awake, alert  and oriented  Airway & Oxygen Therapy: Patient Spontanous Breathing and Patient connected to face mask oxygen  Post-op Assessment: Report given to RN and Post -op Vital signs reviewed and stable  Post vital signs: Reviewed and stable  Last Vitals:  Filed Vitals:   08/09/15 1205  BP: 160/95  Pulse: 71  Temp: 36.8 C  Resp: 11    Complications: No apparent anesthesia complications

## 2015-08-09 NOTE — Anesthesia Postprocedure Evaluation (Signed)
Anesthesia Post Note  Patient: Doris Anderson  Procedure(s) Performed: Procedure(s) (LRB): COLONOSCOPY WITH PROPOFOL (N/A)  Patient location during evaluation: PACU Anesthesia Type: MAC Level of consciousness: awake and alert Pain management: pain level controlled Vital Signs Assessment: post-procedure vital signs reviewed and stable Respiratory status: spontaneous breathing, nonlabored ventilation, respiratory function stable and patient connected to nasal cannula oxygen Cardiovascular status: stable and blood pressure returned to baseline Anesthetic complications: no    Last Vitals:  Filed Vitals:   08/09/15 1350 08/09/15 1400  BP: 149/95 137/101  Pulse: 80   Temp:    Resp: 21     Last Pain: There were no vitals filed for this visit.               Marranda Arakelian J

## 2015-08-09 NOTE — Discharge Instructions (Signed)
Colonoscopy, Care After °These instructions give you information on caring for yourself after your procedure. Your doctor may also give you more specific instructions. Call your doctor if you have any problems or questions after your procedure. °HOME CARE °· Do not drive for 24 hours. °· Do not sign important papers or use machinery for 24 hours. °· You may shower. °· You may go back to your usual activities, but go slower for the first 24 hours. °· Take rest breaks often during the first 24 hours. °· Walk around or use warm packs on your belly (abdomen) if you have belly cramping or gas. °· Drink enough fluids to keep your pee (urine) clear or pale yellow. °· Resume your normal diet. Avoid heavy or fried foods. °· Avoid drinking alcohol for 24 hours or as told by your doctor. °· Only take medicines as told by your doctor. °If a tissue sample (biopsy) was taken during the procedure:  °· Do not take aspirin or blood thinners for 7 days, or as told by your doctor. °· Do not drink alcohol for 7 days, or as told by your doctor. °· Eat soft foods for the first 24 hours. °GET HELP IF: °You still have a small amount of blood in your poop (stool) 2-3 days after the procedure. °GET HELP RIGHT AWAY IF: °· You have more than a small amount of blood in your poop. °· You see clumps of tissue (blood clots) in your poop. °· Your belly is puffy (swollen). °· You feel sick to your stomach (nauseous) or throw up (vomit). °· You have a fever. °· You have belly pain that gets worse and medicine does not help. °MAKE SURE YOU: °· Understand these instructions. °· Will watch your condition. °· Will get help right away if you are not doing well or get worse. °  °This information is not intended to replace advice given to you by your health care provider. Make sure you discuss any questions you have with your health care provider. °  °Document Released: 06/02/2010 Document Revised: 05/05/2013 Document Reviewed: 01/05/2013 °Elsevier  Interactive Patient Education ©2016 [employer] ° ° °Monitored Anesthesia Care °Monitored anesthesia care is an anesthesia service for a medical procedure. Anesthesia is the loss of the ability to feel pain. It is produced by medicines called anesthetics. It may affect a small area of your body (local anesthesia), a large area of your body (regional anesthesia), or your entire body (general anesthesia). The need for monitored anesthesia care depends your procedure, your condition, and the potential need for regional or general anesthesia. It is often provided during procedures where:  °· General anesthesia may be needed if there are complications. This is because you need special care when you are under general anesthesia.   °· You will be under local or regional anesthesia. This is so that you are able to have higher levels of anesthesia if needed.   °· You will receive calming medicines (sedatives). This is especially the case if sedatives are given to put you in a semi-conscious state of relaxation (deep sedation). This is because the amount of sedative needed to produce this state can be hard to predict. Too much of a sedative can produce general anesthesia. °Monitored anesthesia care is performed by one or more health care providers who have special training in all types of anesthesia. You will need to meet with these health care providers before your procedure. During this meeting, they will ask you about your medical history. They will   also give you instructions to follow. (For example, you will need to stop eating and drinking before your procedure. You may also need to stop or change medicines you are taking.) During your procedure, your health care providers will stay with you. They will:  °· Watch your condition. This includes watching your blood pressure, breathing, and level of pain.   °· Diagnose and treat problems that occur.   °· Give medicines if they are needed. These may include calming  medicines (sedatives) and anesthetics.   °· Make sure you are comfortable.   °Having monitored anesthesia care does not necessarily mean that you will be under anesthesia. It does mean that your health care providers will be able to manage anesthesia if you need it or if it occurs. It also means that you will be able to have a different type of anesthesia than you are having if you need it. When your procedure is complete, your health care providers will continue to watch your condition. They will make sure any medicines wear off before you are allowed to go home.  °  °This information is not intended to replace advice given to you by your health care provider. Make sure you discuss any questions you have with your health care provider. °  °Document Released: 01/24/2005 Document Revised: 05/21/2014 Document Reviewed: 06/11/2012 °Elsevier Interactive Patient Education ©2016 [employer] ° °

## 2015-08-09 NOTE — Anesthesia Preprocedure Evaluation (Addendum)
Anesthesia Evaluation  Patient identified by MRN, date of birth, ID band Patient awake    Reviewed: Allergy & Precautions, NPO status , Patient's Chart, lab work & pertinent test results  Airway Mallampati: II  TM Distance: >3 FB Neck ROM: Full    Dental no notable dental hx.    Pulmonary neg pulmonary ROS,    Pulmonary exam normal breath sounds clear to auscultation       Cardiovascular hypertension, Pt. on medications Normal cardiovascular exam Rhythm:Regular Rate:Normal     Neuro/Psych  Headaches, PSYCHIATRIC DISORDERS Depression    GI/Hepatic negative GI ROS, Neg liver ROS,   Endo/Other  negative endocrine ROS  Renal/GU negative Renal ROS  negative genitourinary   Musculoskeletal  (+) Arthritis ,   Abdominal (+) + obese,   Peds negative pediatric ROS (+)  Hematology negative hematology ROS (+)   Anesthesia Other Findings   Reproductive/Obstetrics negative OB ROS                             Anesthesia Physical Anesthesia Plan  ASA: II  Anesthesia Plan: MAC   Post-op Pain Management:    Induction: Intravenous  Airway Management Planned: Natural Airway  Additional Equipment:   Intra-op Plan:   Post-operative Plan:   Informed Consent: I have reviewed the patients History and Physical, chart, labs and discussed the procedure including the risks, benefits and alternatives for the proposed anesthesia with the patient or authorized representative who has indicated his/her understanding and acceptance.   Dental advisory given  Plan Discussed with: CRNA  Anesthesia Plan Comments:        Anesthesia Quick Evaluation

## 2015-08-10 ENCOUNTER — Encounter (HOSPITAL_COMMUNITY): Payer: Self-pay | Admitting: Gastroenterology

## 2015-09-09 ENCOUNTER — Other Ambulatory Visit: Payer: Self-pay

## 2015-09-09 DIAGNOSIS — Z1231 Encounter for screening mammogram for malignant neoplasm of breast: Secondary | ICD-10-CM

## 2015-10-05 ENCOUNTER — Ambulatory Visit: Payer: Managed Care, Other (non HMO)

## 2015-11-14 ENCOUNTER — Ambulatory Visit
Admission: RE | Admit: 2015-11-14 | Discharge: 2015-11-14 | Disposition: A | Discharge status: Home / Self Care | Payer: Managed Care, Other (non HMO) | Source: Ambulatory Visit

## 2015-11-14 DIAGNOSIS — Z1231 Encounter for screening mammogram for malignant neoplasm of breast: Secondary | ICD-10-CM

## 2016-10-24 ENCOUNTER — Other Ambulatory Visit: Payer: Self-pay | Admitting: Family Medicine

## 2016-10-24 ENCOUNTER — Other Ambulatory Visit (HOSPITAL_COMMUNITY)
Admission: RE | Admit: 2016-10-24 | Discharge: 2016-10-24 | Disposition: A | Discharge status: Home / Self Care | Payer: Managed Care, Other (non HMO) | Source: Ambulatory Visit | Attending: Family Medicine | Admitting: Family Medicine

## 2016-10-24 DIAGNOSIS — Z01411 Encounter for gynecological examination (general) (routine) with abnormal findings: Secondary | ICD-10-CM | POA: Insufficient documentation

## 2016-10-25 LAB — CYTOLOGY - PAP: DIAGNOSIS: NEGATIVE

## 2016-11-15 ENCOUNTER — Other Ambulatory Visit: Payer: Self-pay | Admitting: Family Medicine

## 2016-11-15 DIAGNOSIS — Z1231 Encounter for screening mammogram for malignant neoplasm of breast: Secondary | ICD-10-CM

## 2016-11-23 ENCOUNTER — Ambulatory Visit
Admission: RE | Admit: 2016-11-23 | Discharge: 2016-11-23 | Disposition: A | Discharge status: Home / Self Care | Payer: Managed Care, Other (non HMO) | Source: Ambulatory Visit | Attending: Family Medicine | Admitting: Family Medicine

## 2016-11-23 DIAGNOSIS — Z1231 Encounter for screening mammogram for malignant neoplasm of breast: Secondary | ICD-10-CM

## 2017-11-13 ENCOUNTER — Other Ambulatory Visit: Payer: Self-pay | Admitting: Family Medicine

## 2017-11-13 DIAGNOSIS — Z1231 Encounter for screening mammogram for malignant neoplasm of breast: Secondary | ICD-10-CM

## 2017-12-13 ENCOUNTER — Ambulatory Visit
Admission: RE | Admit: 2017-12-13 | Discharge: 2017-12-13 | Disposition: A | Discharge status: Home / Self Care | Payer: Managed Care, Other (non HMO) | Source: Ambulatory Visit | Attending: Family Medicine | Admitting: Family Medicine

## 2017-12-13 DIAGNOSIS — Z1231 Encounter for screening mammogram for malignant neoplasm of breast: Secondary | ICD-10-CM

## 2018-07-31 ENCOUNTER — Telehealth: Payer: Self-pay | Admitting: Internal Medicine

## 2018-07-31 NOTE — Telephone Encounter (Signed)
Referral has been closed bc pt was referred to [facility].

## 2018-11-17 ENCOUNTER — Other Ambulatory Visit: Payer: Self-pay | Admitting: Family Medicine

## 2018-11-17 DIAGNOSIS — Z1231 Encounter for screening mammogram for malignant neoplasm of breast: Secondary | ICD-10-CM

## 2019-01-05 ENCOUNTER — Other Ambulatory Visit: Payer: Self-pay

## 2019-01-05 ENCOUNTER — Ambulatory Visit
Admission: RE | Admit: 2019-01-05 | Discharge: 2019-01-05 | Disposition: A | Discharge status: Home / Self Care | Payer: Managed Care, Other (non HMO) | Source: Ambulatory Visit | Attending: Family Medicine | Admitting: Family Medicine

## 2019-01-05 DIAGNOSIS — Z1231 Encounter for screening mammogram for malignant neoplasm of breast: Secondary | ICD-10-CM

## 2019-11-06 ENCOUNTER — Other Ambulatory Visit: Payer: Self-pay | Admitting: Family Medicine

## 2019-11-06 DIAGNOSIS — N644 Mastodynia: Secondary | ICD-10-CM

## 2019-11-12 ENCOUNTER — Other Ambulatory Visit: Payer: Self-pay | Admitting: Family Medicine

## 2019-11-12 DIAGNOSIS — N644 Mastodynia: Secondary | ICD-10-CM

## 2019-11-27 ENCOUNTER — Other Ambulatory Visit: Payer: Self-pay

## 2019-11-27 ENCOUNTER — Ambulatory Visit: Payer: Managed Care, Other (non HMO)

## 2019-11-27 ENCOUNTER — Ambulatory Visit
Admission: RE | Admit: 2019-11-27 | Discharge: 2019-11-27 | Disposition: A | Discharge status: Home / Self Care | Payer: Managed Care, Other (non HMO) | Source: Ambulatory Visit | Attending: Family Medicine | Admitting: Family Medicine

## 2019-11-27 DIAGNOSIS — N644 Mastodynia: Secondary | ICD-10-CM

## 2019-12-11 ENCOUNTER — Other Ambulatory Visit: Payer: Self-pay | Admitting: Family Medicine

## 2019-12-11 DIAGNOSIS — Z1231 Encounter for screening mammogram for malignant neoplasm of breast: Secondary | ICD-10-CM

## 2020-01-06 ENCOUNTER — Other Ambulatory Visit: Payer: Self-pay

## 2020-01-06 ENCOUNTER — Ambulatory Visit
Admission: RE | Admit: 2020-01-06 | Discharge: 2020-01-06 | Disposition: A | Discharge status: Home / Self Care | Payer: Managed Care, Other (non HMO) | Source: Ambulatory Visit | Attending: Family Medicine | Admitting: Family Medicine

## 2020-01-06 DIAGNOSIS — Z1231 Encounter for screening mammogram for malignant neoplasm of breast: Secondary | ICD-10-CM

## 2020-05-10 ENCOUNTER — Other Ambulatory Visit: Payer: Self-pay | Admitting: Family Medicine

## 2020-05-10 DIAGNOSIS — R928 Other abnormal and inconclusive findings on diagnostic imaging of breast: Secondary | ICD-10-CM

## 2020-06-23 ENCOUNTER — Other Ambulatory Visit: Payer: Managed Care, Other (non HMO)

## 2021-01-30 ENCOUNTER — Other Ambulatory Visit: Payer: Self-pay | Admitting: Family Medicine

## 2021-01-30 DIAGNOSIS — M810 Age-related osteoporosis without current pathological fracture: Secondary | ICD-10-CM

## 2021-02-20 ENCOUNTER — Other Ambulatory Visit: Payer: Self-pay | Admitting: Family Medicine

## 2021-02-20 DIAGNOSIS — Z1231 Encounter for screening mammogram for malignant neoplasm of breast: Secondary | ICD-10-CM

## 2021-03-21 ENCOUNTER — Other Ambulatory Visit: Payer: Self-pay

## 2021-03-21 ENCOUNTER — Ambulatory Visit
Admission: RE | Admit: 2021-03-21 | Discharge: 2021-03-21 | Disposition: A | Discharge status: Home / Self Care | Payer: Managed Care, Other (non HMO) | Source: Ambulatory Visit | Attending: Family Medicine | Admitting: Family Medicine

## 2021-03-21 DIAGNOSIS — Z1231 Encounter for screening mammogram for malignant neoplasm of breast: Secondary | ICD-10-CM

## 2022-02-20 ENCOUNTER — Other Ambulatory Visit: Payer: Self-pay | Admitting: Family Medicine

## 2022-02-20 DIAGNOSIS — Z1231 Encounter for screening mammogram for malignant neoplasm of breast: Secondary | ICD-10-CM

## 2022-03-23 ENCOUNTER — Ambulatory Visit
Admission: RE | Admit: 2022-03-23 | Discharge: 2022-03-23 | Disposition: A | Discharge status: Home / Self Care | Payer: Managed Care, Other (non HMO) | Source: Ambulatory Visit | Attending: Family Medicine | Admitting: Family Medicine

## 2022-03-23 DIAGNOSIS — Z1231 Encounter for screening mammogram for malignant neoplasm of breast: Secondary | ICD-10-CM

## 2022-03-27 ENCOUNTER — Other Ambulatory Visit: Payer: Self-pay | Admitting: Family Medicine

## 2022-03-27 DIAGNOSIS — R928 Other abnormal and inconclusive findings on diagnostic imaging of breast: Secondary | ICD-10-CM

## 2022-04-12 ENCOUNTER — Other Ambulatory Visit: Payer: Self-pay | Admitting: Family Medicine

## 2022-04-12 ENCOUNTER — Ambulatory Visit
Admission: RE | Admit: 2022-04-12 | Discharge: 2022-04-12 | Disposition: A | Discharge status: Home / Self Care | Payer: Managed Care, Other (non HMO) | Source: Ambulatory Visit | Attending: Family Medicine | Admitting: Family Medicine

## 2022-04-12 DIAGNOSIS — R928 Other abnormal and inconclusive findings on diagnostic imaging of breast: Secondary | ICD-10-CM

## 2022-04-20 ENCOUNTER — Ambulatory Visit
Admission: RE | Admit: 2022-04-20 | Discharge: 2022-04-20 | Disposition: A | Discharge status: Home / Self Care | Payer: Managed Care, Other (non HMO) | Source: Ambulatory Visit | Attending: Family Medicine | Admitting: Family Medicine

## 2022-04-20 DIAGNOSIS — R928 Other abnormal and inconclusive findings on diagnostic imaging of breast: Secondary | ICD-10-CM

## 2022-04-20 HISTORY — PX: BREAST BIOPSY: SHX20

## 2023-03-22 ENCOUNTER — Other Ambulatory Visit: Payer: Self-pay | Admitting: Family Medicine

## 2023-03-22 DIAGNOSIS — Z1231 Encounter for screening mammogram for malignant neoplasm of breast: Secondary | ICD-10-CM

## 2023-04-25 ENCOUNTER — Ambulatory Visit: Payer: Managed Care, Other (non HMO)

## 2023-05-09 ENCOUNTER — Ambulatory Visit
Admission: RE | Admit: 2023-05-09 | Discharge: 2023-05-09 | Disposition: A | Discharge status: Home / Self Care | Payer: Managed Care, Other (non HMO) | Source: Ambulatory Visit | Attending: Family Medicine | Admitting: Family Medicine

## 2023-05-09 DIAGNOSIS — Z1231 Encounter for screening mammogram for malignant neoplasm of breast: Secondary | ICD-10-CM

## 2024-05-22 ENCOUNTER — Other Ambulatory Visit: Payer: Self-pay | Admitting: Family Medicine

## 2024-05-22 DIAGNOSIS — Z1231 Encounter for screening mammogram for malignant neoplasm of breast: Secondary | ICD-10-CM

## 2024-05-26 ENCOUNTER — Ambulatory Visit

## 2024-05-29 ENCOUNTER — Ambulatory Visit
Admission: RE | Admit: 2024-05-29 | Discharge: 2024-05-29 | Disposition: A | Discharge status: Home / Self Care | Source: Ambulatory Visit | Attending: Family Medicine | Admitting: Family Medicine

## 2024-05-29 DIAGNOSIS — Z1231 Encounter for screening mammogram for malignant neoplasm of breast: Secondary | ICD-10-CM
